# Patient Record
Sex: Male | Born: 2005 | Hispanic: Yes | Marital: Single | Smoking: Never smoker
Health system: Southern US, Community
[De-identification: ages and names within clinical notes are randomized; demographics above are authoritative.]

## PROBLEM LIST (undated history)

## (undated) DIAGNOSIS — Z789 Other specified health status: Secondary | ICD-10-CM

## (undated) HISTORY — DX: Other specified health status: Z78.9

---
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
## 2024-07-28 ENCOUNTER — Other Ambulatory Visit: Payer: Self-pay

## 2024-07-28 ENCOUNTER — Encounter (HOSPITAL_COMMUNITY): Payer: Self-pay | Admitting: Emergency Medicine

## 2024-07-28 ENCOUNTER — Encounter (HOSPITAL_COMMUNITY): Payer: Self-pay | Admitting: *Deleted

## 2024-07-28 ENCOUNTER — Emergency Department (HOSPITAL_COMMUNITY)
Admission: EM | Admit: 2024-07-28 | Discharge: 2024-07-28 | Disposition: A | Discharge status: Home / Self Care | Source: Ambulatory Visit | Attending: Emergency Medicine | Admitting: Emergency Medicine

## 2024-07-28 ENCOUNTER — Ambulatory Visit (HOSPITAL_COMMUNITY): Admission: EM | Admit: 2024-07-28 | Discharge: 2024-07-28 | Disposition: A | Discharge status: Home / Self Care

## 2024-07-28 ENCOUNTER — Emergency Department (HOSPITAL_COMMUNITY)

## 2024-07-28 DIAGNOSIS — E871 Hypo-osmolality and hyponatremia: Secondary | ICD-10-CM | POA: Insufficient documentation

## 2024-07-28 DIAGNOSIS — N50812 Left testicular pain: Secondary | ICD-10-CM

## 2024-07-28 DIAGNOSIS — N451 Epididymitis: Secondary | ICD-10-CM | POA: Diagnosis not present

## 2024-07-28 DIAGNOSIS — N433 Hydrocele, unspecified: Secondary | ICD-10-CM | POA: Diagnosis not present

## 2024-07-28 DIAGNOSIS — N5089 Other specified disorders of the male genital organs: Secondary | ICD-10-CM | POA: Diagnosis not present

## 2024-07-28 DIAGNOSIS — N50819 Testicular pain, unspecified: Secondary | ICD-10-CM | POA: Diagnosis not present

## 2024-07-28 LAB — URINALYSIS, W/ REFLEX TO CULTURE (INFECTION SUSPECTED)
Bacteria, UA: NONE SEEN
Bilirubin Urine: NEGATIVE
Glucose, UA: NEGATIVE mg/dL
Hgb urine dipstick: NEGATIVE
Ketones, ur: NEGATIVE mg/dL
Leukocytes,Ua: NEGATIVE
Nitrite: NEGATIVE
Protein, ur: NEGATIVE mg/dL
Specific Gravity, Urine: 1.003 — ABNORMAL LOW (ref 1.005–1.030)
pH: 6 (ref 5.0–8.0)

## 2024-07-28 LAB — COMPREHENSIVE METABOLIC PANEL WITH GFR
ALT: 36 U/L (ref 0–44)
AST: 29 U/L (ref 15–41)
Albumin: 4.4 g/dL (ref 3.5–5.0)
Alkaline Phosphatase: 85 U/L (ref 38–126)
Anion gap: 9 (ref 5–15)
BUN: 10 mg/dL (ref 6–20)
CO2: 24 mmol/L (ref 22–32)
Calcium: 9.6 mg/dL (ref 8.9–10.3)
Chloride: 100 mmol/L (ref 98–111)
Creatinine, Ser: 0.98 mg/dL (ref 0.61–1.24)
GFR, Estimated: >60 mL/min (ref >60)
Glucose, Bld: 103 mg/dL — ABNORMAL HIGH (ref 70–99)
Potassium: 4.5 mmol/L (ref 3.5–5.1)
Sodium: 133 mmol/L — ABNORMAL LOW (ref 135–145)
Total Bilirubin: 1.2 mg/dL (ref 0.0–1.2)
Total Protein: 7.5 g/dL (ref 6.5–8.1)

## 2024-07-28 LAB — CBC
HCT: 46.7 % (ref 39.0–52.0)
Hemoglobin: 15.5 g/dL (ref 13.0–17.0)
MCH: 31.4 pg (ref 26.0–34.0)
MCHC: 33.2 g/dL (ref 30.0–36.0)
MCV: 94.5 fL (ref 80.0–100.0)
Platelets: 200 K/uL (ref 150–400)
RBC: 4.94 MIL/uL (ref 4.22–5.81)
RDW: 12.1 % (ref 11.5–15.5)
WBC: 6.2 K/uL (ref 4.0–10.5)
nRBC: 0 % (ref 0.0–0.2)

## 2024-07-28 NOTE — ED Triage Notes (Signed)
 Pt reports pain to Lt side of testicles. Pt also reports ABD pain with swelling to ABD. Pain started at noon today . Pt denies any injury .

## 2024-07-28 NOTE — Discharge Instructions (Addendum)
 You were seen in the emergency department today for left testicle pain. while you were here we did labs, physical exam and ultrasound of your testicles which showed concern for epididymitis. You had no evidence of UTI. I have attached information sheet about epididymitis.  Given sudden onset and rapid improvement of your symptoms, this may have been caused by small trauma to the testicles.  You may take ibuprofen , 400 to 600 mg every 6 hours for your pain at home.  Please follow-up on STD testing that we performed.  This should be available in your portal by tomorrow.  If any of these are positive, is very important to get treatment and treatment for your partner as well.  Please follow-up with urology if symptoms continue. You may call Dr. Neale office at the number below.  Come back to the emergency department if you develop fevers, worsening of your pain, or if you have any other reason to believe you need emergency care

## 2024-07-28 NOTE — ED Provider Notes (Signed)
  [facility] [place]  Note:  This document was prepared using Conservation officer, historic buildings and may include unintentional dictation errors.  MRN: [id number] DOB: March 10, 2006  Subjective:   Jonathan Rosario is a 18 y.o. male presenting for acute onset left testicular pain that started today around noon.  Patient denies any known injury or trauma.  No past history of any testicular pain or swelling.  Patient has not taken any over-the-counter medication to treat symptoms.  No current facility-administered medications for this encounter.  Current Outpatient Medications:    acetaminophen (TYLENOL) 160 MG chewable tablet, Chew 160 mg by mouth every 6 (six) hours as needed for pain. (Patient not taking: Reported on 09/16/2020), Disp: , Rfl:    amoxicillin  (AMOXIL ) 500 MG capsule, Take 1 capsule (500 mg total) by mouth 3 (three) times daily., Disp: 21 capsule, Rfl: 0   No Known Allergies  Past Medical History:  Diagnosis Date   Medical history non-contributory      History reviewed. No pertinent surgical history.  Family History  Problem Relation Age of Onset   Hypertension Mother    Hypertension Father    Heart disease Father        died at age 49 of a heart attack   Hypertension Maternal Grandmother     Social History   Tobacco Use   Smoking status: Never   Smokeless tobacco: Never    ROS Refer to HPI for ROS details.  Objective:    Vitals: BP (!) 158/92   Pulse 86   Temp 98 F (36.7 C)   Resp 20   SpO2 99%   Physical Exam Vitals and nursing note reviewed.  Constitutional:      General: He is not in acute distress.    Appearance: He is well-developed. He is not ill-appearing or toxic-appearing.  HENT:     Head: Normocephalic.  Cardiovascular:     Rate and Rhythm: Normal rate.  Pulmonary:     Effort: Pulmonary effort is normal. No respiratory distress.  Abdominal:     Tenderness: There is no abdominal tenderness.  Genitourinary:     Testes:        Left: Tenderness and swelling present.  Skin:    General: Skin is warm and dry.  Neurological:     General: No focal deficit present.     Mental Status: He is alert and oriented to person, place, and time.  Psychiatric:        Mood and Affect: Mood normal.        Behavior: Behavior normal.     Procedures  No results found for this or any previous visit (from the past 24 hours).  Assessment and Plan :     Discharge Instructions       1. Pain in testicle, unspecified laterality (Primary)  - Due to current symptom of severe testicular pain with no known injury or trauma, recommend immediate follow-up in the emergency department for further evaluation and treatment. -Acute onset testicular pain could possibly be testicular torsion which is a medical emergency and requires advanced imaging to diagnose. -Go to [facility] emergency department after leaving urgent care for further evaluation and treatment.      Aida Lemaire B Seraphim Trow   Rayn Enderson B, [place] 07/28/24 1430

## 2024-07-28 NOTE — ED Triage Notes (Signed)
 Pt sent from UC for US  for possible testicular torsion.

## 2024-07-28 NOTE — ED Provider Notes (Signed)
  [place] EMERGENCY DEPARTMENT AT [facility] Provider Note HPI Vanden Fawaz is a 18 y.o. male with no significant past medical history presents to the emergency department with acute onset left testicular pain and swelling at noon.  Patient was walking to his car from a coffee shop when he started having severe left testicular pain.  He was enlarged and high riding compared to the right.  Denies trauma to the area.  Patient sexually active with 1 partner.  States that he has no concerns for STI.  Denies dysuria or hematuria.  Denies vomiting or fevers.  He has had improvement of his pain since arriving to the emergency department.  Past Medical History:  Diagnosis Date   Medical history non-contributory    History reviewed. No pertinent surgical history.  Review of Systems Pertinent positives and negative findings are listed as part of the History of Present Illness and MDM  Physical Exam Vitals:   07/28/24 1433 07/28/24 1800  BP: (!) 160/80 (!) 157/93  Pulse: 85 69  Resp: 18 16  Temp: 99 F (37.2 C) 98.2 F (36.8 C)  TempSrc:  Oral  SpO2: 95% 100%     Constitutional Nursing notes reviewed Vital signs reviewed  HEENT No obvious trauma Pupils cross midline  Respiratory Effort normal Breathing well on room air  CV Normal rate and rhythm   Abdomen Soft, non-tender, non-distended No peritonitis  GU Uncircumcised penis with no discharge Left tender and high riding testicle No evidence of hernia  Neuro Awake and alert Pupils cross midline Moving all extremities    MDM:  Initial Differential Diagnoses includes testicular torsion, epididymitis, soft tissue infection, hernia, orchitis, testicular trauma  I reviewed the patient's vitals, the nursing triage note and evaluated the patient at bedside.  Patient presents with acute onset left testicular pain and swelling at noon.  No preceding trauma.  No dysuria or hematuria.  No abnormal urethral discharge.   No hernia on physical exam high concern for testicular torsion.  Ultrasound, UA and GC chlamydia ordered.  Ultrasound reviewed by myself which shows no evidence of testicular torsion.  Findings equivocal for left-sided epididymitis.  UA is noninfectious.  Remainder of the labs show no leukocytosis, anemia, AKI or acute liver injury.  Patient states that his pain is resolved.  I discussed empiric antibiotic treatment for STIs with the patient, but he would like to hold off at this time as he does not think he has an STI.  I believe this is reasonable as there is no definitive evidence of epididymitis.  Patient will follow-up on the results of his STI testing at home.  Given resolution of symptoms, patient discharged home with PCP and urology follow-up   Procedures: Procedures  Medications administered in the ED: Medications - No data to display   Impression: 1. Epididymitis   2. Pain in left testicle      Patient's presentation is most consistent with acute presentation with potential threat to life or bodily function.  Disposition: ED Disposition:  Discharge   Discharge: Patient is felt to be medically appropriate for discharge at this time. Patient was instructed to follow up with their primary care doctor/specialists listed above for re-evaluation. Patient was given strict return precautions.  ED Discharge Orders     None             Dionisio Blunt, MD 07/28/24 2322    Bernard Drivers, MD 07/29/24 [phone]

## 2024-07-28 NOTE — Discharge Instructions (Signed)
  1. Pain in testicle, unspecified laterality (Primary)  - Due to current symptom of severe testicular pain with no known injury or trauma, recommend immediate follow-up in the emergency department for further evaluation and treatment. -Acute onset testicular pain could possibly be testicular torsion which is a medical emergency and requires advanced imaging to diagnose. -Go to [facility] emergency department after leaving urgent care for further evaluation and treatment.

## 2024-07-30 LAB — URINE CYTOLOGY ANCILLARY ONLY
Chlamydia: NEGATIVE
Comment: NEGATIVE
Comment: NEGATIVE
Comment: NORMAL
Neisseria Gonorrhea: NEGATIVE
Trichomonas: NEGATIVE
